# Patient Record
Sex: Male | Born: 2000
Health system: Southern US, Community
[De-identification: ages and names within clinical notes are randomized; demographics above are authoritative.]

## PROBLEM LIST (undated history)

## (undated) DIAGNOSIS — J45909 Unspecified asthma, uncomplicated: Secondary | ICD-10-CM

---
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
## 2019-01-23 DIAGNOSIS — Z0001 Encounter for general adult medical examination with abnormal findings: Secondary | ICD-10-CM | POA: Diagnosis not present

## 2019-01-23 DIAGNOSIS — Z713 Dietary counseling and surveillance: Secondary | ICD-10-CM | POA: Diagnosis not present

## 2019-01-23 DIAGNOSIS — Z113 Encounter for screening for infections with a predominantly sexual mode of transmission: Secondary | ICD-10-CM | POA: Diagnosis not present

## 2019-01-23 DIAGNOSIS — Z68.41 Body mass index (BMI) pediatric, 5th percentile to less than 85th percentile for age: Secondary | ICD-10-CM | POA: Diagnosis not present

## 2019-01-23 DIAGNOSIS — E079 Disorder of thyroid, unspecified: Secondary | ICD-10-CM | POA: Diagnosis not present

## 2019-01-23 DIAGNOSIS — R030 Elevated blood-pressure reading, without diagnosis of hypertension: Secondary | ICD-10-CM | POA: Diagnosis not present

## 2019-01-23 DIAGNOSIS — Z1331 Encounter for screening for depression: Secondary | ICD-10-CM | POA: Diagnosis not present

## 2019-01-23 DIAGNOSIS — R6889 Other general symptoms and signs: Secondary | ICD-10-CM | POA: Diagnosis not present

## 2019-08-23 DIAGNOSIS — R4582 Worries: Secondary | ICD-10-CM | POA: Diagnosis not present

## 2019-08-23 DIAGNOSIS — F329 Major depressive disorder, single episode, unspecified: Secondary | ICD-10-CM | POA: Diagnosis not present

## 2020-07-10 DIAGNOSIS — W1809XA Striking against other object with subsequent fall, initial encounter: Secondary | ICD-10-CM | POA: Diagnosis not present

## 2020-07-10 DIAGNOSIS — S01511A Laceration without foreign body of lip, initial encounter: Secondary | ICD-10-CM | POA: Diagnosis not present

## 2021-08-05 DIAGNOSIS — Z23 Encounter for immunization: Secondary | ICD-10-CM | POA: Diagnosis not present

## 2021-08-07 DIAGNOSIS — M545 Low back pain, unspecified: Secondary | ICD-10-CM | POA: Diagnosis not present

## 2021-10-03 DIAGNOSIS — S61210A Laceration without foreign body of right index finger without damage to nail, initial encounter: Secondary | ICD-10-CM | POA: Diagnosis not present

## 2021-10-03 DIAGNOSIS — S61216A Laceration without foreign body of right little finger without damage to nail, initial encounter: Secondary | ICD-10-CM | POA: Diagnosis not present

## 2021-10-05 DIAGNOSIS — S61210D Laceration without foreign body of right index finger without damage to nail, subsequent encounter: Secondary | ICD-10-CM | POA: Diagnosis not present

## 2021-10-05 DIAGNOSIS — S61216D Laceration without foreign body of right little finger without damage to nail, subsequent encounter: Secondary | ICD-10-CM | POA: Diagnosis not present
